# Patient Record
Sex: Male | Born: 1975 | Race: Black or African American | Hispanic: No | Marital: Single | Smoking: Former smoker
Health system: Southern US, Community
[De-identification: ages and names within clinical notes are randomized; demographics above are authoritative.]

## PROBLEM LIST (undated history)

## (undated) DIAGNOSIS — G510 Bell's palsy: Secondary | ICD-10-CM

## (undated) DIAGNOSIS — S0181XA Laceration without foreign body of other part of head, initial encounter: Secondary | ICD-10-CM

## (undated) HISTORY — PX: FACIAL COSMETIC SURGERY: SHX629

---

## 2000-11-01 ENCOUNTER — Emergency Department (HOSPITAL_COMMUNITY): Admission: EM | Admit: 2000-11-01 | Discharge: 2000-11-01 | Payer: Self-pay | Admitting: Internal Medicine

## 2005-01-31 ENCOUNTER — Encounter: Payer: Self-pay | Admitting: Emergency Medicine

## 2005-01-31 ENCOUNTER — Inpatient Hospital Stay (HOSPITAL_COMMUNITY): Admission: EM | Admit: 2005-01-31 | Discharge: 2005-02-02 | Payer: Self-pay | Admitting: Emergency Medicine

## 2005-03-05 ENCOUNTER — Ambulatory Visit (HOSPITAL_COMMUNITY): Admission: RE | Admit: 2005-03-05 | Discharge: 2005-03-05 | Payer: Self-pay | Admitting: Otolaryngology
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
## 2014-02-13 ENCOUNTER — Encounter (HOSPITAL_COMMUNITY): Payer: Self-pay | Admitting: *Deleted

## 2014-02-13 ENCOUNTER — Emergency Department (HOSPITAL_COMMUNITY)
Admission: EM | Admit: 2014-02-13 | Discharge: 2014-02-13 | Disposition: A | Discharge status: Home / Self Care | Payer: Self-pay | Attending: Emergency Medicine | Admitting: Emergency Medicine

## 2014-02-13 DIAGNOSIS — G510 Bell's palsy: Secondary | ICD-10-CM | POA: Insufficient documentation

## 2014-02-13 DIAGNOSIS — Z720 Tobacco use: Secondary | ICD-10-CM | POA: Insufficient documentation

## 2014-02-13 DIAGNOSIS — M79605 Pain in left leg: Secondary | ICD-10-CM | POA: Insufficient documentation

## 2014-02-13 HISTORY — DX: Bell's palsy: G51.0

## 2014-02-13 HISTORY — DX: Laceration without foreign body of other part of head, initial encounter: S01.81XA

## 2014-02-13 MED ORDER — PREDNISONE 50 MG PO TABS
60.0000 mg | ORAL_TABLET | Freq: Once | ORAL | Status: AC
  Administered 2014-02-13: 60 mg via ORAL
  Filled 2014-02-13 (×2): qty 1

## 2014-02-13 MED ORDER — HYDROCODONE-ACETAMINOPHEN 5-325 MG PO TABS: 1.0000 | ORAL_TABLET | Freq: Four times a day (QID) | ORAL | Status: DC | PRN

## 2014-02-13 MED ORDER — VALACYCLOVIR HCL 1 G PO TABS: 1000.0000 mg | ORAL_TABLET | Freq: Three times a day (TID) | ORAL | Status: DC

## 2014-02-13 MED ORDER — PREDNISONE 20 MG PO TABS: ORAL_TABLET | ORAL | Status: DC
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
## 2014-02-13 NOTE — ED Notes (Signed)
Pt states woke up yesterday morning around 1100 & noticed he had some facial drooping. Pt dx w/ bail palsy in march of this year. Pt also states has been having left leg pain that radiates to feet for the past 3 weeks or so.

## 2014-02-13 NOTE — Discharge Instructions (Signed)
Bell's Palsy °Bell's palsy is a condition in which the muscles on one side of the face cannot move (paralysis). This is because the nerves in the face are paralyzed. It is most often thought to be caused by a virus. The virus causes swelling of the nerve that controls movement on one side of the face. The nerve travels through a tight space surrounded by bone. When the nerve swells, it can be compressed by the bone. This results in damage to the protective covering around the nerve. This damage interferes with how the nerve communicates with the muscles of the face. As a result, it can cause weakness or paralysis of the facial muscles.  °Injury (trauma), tumor, and surgery may cause Bell's palsy, but most of the time the cause is unknown. It is a relatively common condition. It starts suddenly (abrupt onset) with the paralysis usually ending within 2 days. Bell's palsy is not dangerous. But because the eye does not close properly, you may need care to keep the eye from getting dry. This can include splinting (to keep the eye shut) or moistening with artificial tears. Bell's palsy very seldom occurs on both sides of the face at the same time. °SYMPTOMS  °· Eyebrow sagging. °· Drooping of the eyelid and corner of the mouth. °· Inability to close one eye. °· Loss of taste on the front of the tongue. °· Sensitivity to loud noises. °TREATMENT  °The treatment is usually non-surgical. If the patient is seen within the first 24 to 48 hours, a short course of steroids may be prescribed, in an attempt to shorten the length of the condition. Antiviral medicines may also be used with the steroids, but it is unclear if they are helpful.  °You will need to protect your eye, if you cannot close it. The cornea (clear covering over your eye) will become dry and can be damaged. Artificial tears can be used to keep your eye moist. Glasses or an eye patch should be worn to protect your eye. °PROGNOSIS  °Recovery is variable, ranging  from days to months. Although the problem usually goes away completely (about 80% of cases resolve), predicting the outcome is impossible. Most people improve within 3 weeks of when the symptoms began. Improvement may continue for 3 to 6 months. A small number of people have moderate to severe weakness that is permanent.  °HOME CARE INSTRUCTIONS  °· If your caregiver prescribed medication to reduce swelling in the nerve, use as directed. Do not stop taking the medication unless directed by your caregiver. °· Use moisturizing eye drops as needed to prevent drying of your eye, as directed by your caregiver. °· Protect your eye, as directed by your caregiver. °· Use facial massage and exercises, as directed by your caregiver. °· Perform your normal activities, and get your normal rest. °SEEK IMMEDIATE MEDICAL CARE IF:  °· There is pain, redness or irritation in the eye. °· You or your child has an oral temperature above 102° F (38.9° C), not controlled by medicine. °MAKE SURE YOU:  °· Understand these instructions. °· Will watch your condition. °· Will get help right away if you are not doing well or get worse. °Document Released: 02/03/2005 Document Revised: 04/28/2011 Document Reviewed: 05/13/2013 °[employer]® Patient Information ©2015 [employer]. This information is not intended to replace advice given to you by your health care provider. Make sure you discuss any questions you have with your health care provider. ° °

## 2018-02-11 ENCOUNTER — Emergency Department (HOSPITAL_COMMUNITY): Payer: Self-pay

## 2018-02-11 ENCOUNTER — Other Ambulatory Visit: Payer: Self-pay

## 2018-02-11 ENCOUNTER — Encounter (HOSPITAL_COMMUNITY): Payer: Self-pay | Admitting: Emergency Medicine

## 2018-02-11 ENCOUNTER — Emergency Department (HOSPITAL_COMMUNITY)
Admission: EM | Admit: 2018-02-11 | Discharge: 2018-02-11 | Disposition: A | Discharge status: Home / Self Care | Payer: Self-pay | Attending: Emergency Medicine | Admitting: Emergency Medicine

## 2018-02-11 DIAGNOSIS — R3129 Other microscopic hematuria: Secondary | ICD-10-CM | POA: Insufficient documentation

## 2018-02-11 DIAGNOSIS — M546 Pain in thoracic spine: Secondary | ICD-10-CM | POA: Insufficient documentation

## 2018-02-11 DIAGNOSIS — F17200 Nicotine dependence, unspecified, uncomplicated: Secondary | ICD-10-CM | POA: Insufficient documentation

## 2018-02-11 LAB — BASIC METABOLIC PANEL
Anion gap: 4 — ABNORMAL LOW (ref 5–15)
BUN: 13 mg/dL (ref 6–20)
CO2: 27 mmol/L (ref 22–32)
Calcium: 10 mg/dL (ref 8.9–10.3)
Chloride: 108 mmol/L (ref 98–111)
Creatinine, Ser: 1.02 mg/dL (ref 0.61–1.24)
GFR calc Af Amer: >60 mL/min (ref >60)
GFR calc non Af Amer: >60 mL/min (ref >60)
Glucose, Bld: 94 mg/dL (ref 70–99)
POTASSIUM: 4.1 mmol/L (ref 3.5–5.1)
Sodium: 139 mmol/L (ref 135–145)

## 2018-02-11 LAB — CBC WITH DIFFERENTIAL/PLATELET
Abs Immature Granulocytes: 0.03 10*3/uL (ref 0.00–0.07)
BASOS ABS: 0 10*3/uL (ref 0.0–0.1)
Basophils Relative: 0 %
Eosinophils Absolute: 0.1 10*3/uL (ref 0.0–0.5)
Eosinophils Relative: 1 %
HCT: 48.8 % (ref 39.0–52.0)
Hemoglobin: 16.3 g/dL (ref 13.0–17.0)
Immature Granulocytes: 0 %
Lymphocytes Relative: 42 %
Lymphs Abs: 3.2 10*3/uL (ref 0.7–4.0)
MCH: 30 pg (ref 26.0–34.0)
MCHC: 33.4 g/dL (ref 30.0–36.0)
MCV: 89.9 fL (ref 80.0–100.0)
Monocytes Absolute: 0.4 10*3/uL (ref 0.1–1.0)
Monocytes Relative: 5 %
NRBC: 0 % (ref 0.0–0.2)
Neutro Abs: 4 10*3/uL (ref 1.7–7.7)
Neutrophils Relative %: 52 %
Platelets: 262 10*3/uL (ref 150–400)
RBC: 5.43 MIL/uL (ref 4.22–5.81)
RDW: 13 % (ref 11.5–15.5)
WBC: 7.7 10*3/uL (ref 4.0–10.5)

## 2018-02-11 LAB — URINALYSIS, ROUTINE W REFLEX MICROSCOPIC
Bacteria, UA: NONE SEEN
Bilirubin Urine: NEGATIVE
Glucose, UA: NEGATIVE mg/dL
Ketones, ur: NEGATIVE mg/dL
Leukocytes, UA: NEGATIVE
Nitrite: NEGATIVE
Protein, ur: NEGATIVE mg/dL
Specific Gravity, Urine: 1.015 (ref 1.005–1.030)
pH: 6 (ref 5.0–8.0)

## 2018-02-11 MED ORDER — IBUPROFEN 400 MG PO TABS
600.0000 mg | ORAL_TABLET | Freq: Once | ORAL | Status: AC
  Administered 2018-02-11: 600 mg via ORAL
  Filled 2018-02-11: qty 2

## 2018-02-11 NOTE — ED Triage Notes (Signed)
Patient complaining of right flank pain x 1 week. Denies dysuria. 

## 2018-02-11 NOTE — ED Notes (Signed)
Patient given discharge instruction, verbalized understand. Patient ambulatory out of the department.  
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
## 2018-02-11 NOTE — ED Notes (Signed)
Pt gone to CT 

## 2018-02-11 NOTE — Discharge Instructions (Signed)
Take ibuprofen and/or Tylenol for your pain.  If you develop worsening pain, weakness or numbness in your legs, incontinence of your bowels or bladder, vomiting, or any other new/concerning symptoms then return to the ER for evaluation.  There was some mild amount of blood seen in your urine test today.  You need to have this followed up with your primary care doctor to get this rechecked to ensure it clears.

## 2021-09-21 ENCOUNTER — Emergency Department (HOSPITAL_COMMUNITY)
Admission: EM | Admit: 2021-09-21 | Discharge: 2021-09-21 | Disposition: A | Discharge status: Home / Self Care | Payer: Self-pay | Attending: Student | Admitting: Student

## 2021-09-21 ENCOUNTER — Encounter (HOSPITAL_COMMUNITY): Payer: Self-pay

## 2021-09-21 DIAGNOSIS — K0889 Other specified disorders of teeth and supporting structures: Secondary | ICD-10-CM | POA: Insufficient documentation

## 2021-09-21 MED ORDER — IBUPROFEN 600 MG PO TABS: 600.0000 mg | ORAL_TABLET | Freq: Four times a day (QID) | ORAL | 0 refills | Status: DC | PRN

## 2021-09-21 MED ORDER — AMOXICILLIN-POT CLAVULANATE 875-125 MG PO TABS: 1.0000 | ORAL_TABLET | Freq: Two times a day (BID) | ORAL | 0 refills | Status: DC

## 2021-09-21 MED ORDER — IBUPROFEN 800 MG PO TABS
800.0000 mg | ORAL_TABLET | Freq: Once | ORAL | Status: AC
  Administered 2021-09-21: 800 mg via ORAL
  Filled 2021-09-21: qty 1

## 2021-09-21 NOTE — ED Triage Notes (Addendum)
Pt states he has been having swelling in right neck lymph nodes since this morning. Pt is concerned that he has a tooth infection/abscess on right  lower side of mouth

## 2021-09-21 NOTE — ED Provider Notes (Signed)
[facility] EMERGENCY DEPARTMENT Provider Note   CSN: [id number] Arrival date & time: 09/21/21  1754     History Chief Complaint  Patient presents with   Dental Pain    Ivan Johnson is a 46 y.o. male patient who presents to the emergency department today for further evaluation of dental pain.  This been ongoing for the last few days.  He has been trying to self medicate at home.  He has some associated pain down into the right side of the neck.  Able to eat and drink normally.   Dental Pain      Home Medications Prior to Admission medications   Medication Sig Start Date End Date Taking? Authorizing Provider  amoxicillin-clavulanate (AUGMENTIN) 875-125 MG tablet Take 1 tablet by mouth every 12 (twelve) hours. 09/21/21  Yes Meredeth Ide, Iktan Aikman M, PA-C  ibuprofen (ADVIL) 600 MG tablet Take 1 tablet (600 mg total) by mouth every 6 (six) hours as needed. 09/21/21  Yes Meredeth Ide, Kately Graffam M, PA-C  HYDROcodone-acetaminophen (NORCO/VICODIN) 5-325 MG per tablet Take 1 tablet by mouth every 6 (six) hours as needed for severe pain. 02/13/14   Pricilla Loveless, MD  predniSONE (DELTASONE) 20 MG tablet 60 mg x 4 days, 50 mg x 1 day, 40 mg x 1 day, 30 mg x 1 day, 20 mg x 1 day, 10 mg x 1 day, then stop 02/13/14   Pricilla Loveless, MD  valACYclovir (VALTREX) 1000 MG tablet Take 1 tablet (1,000 mg total) by mouth 3 (three) times daily. 02/13/14   Pricilla Loveless, MD      Allergies    Patient has no known allergies.    Review of Systems   Review of Systems  All other systems reviewed and are negative.   Physical Exam Updated Vital Signs BP (!) 141/91 (BP Location: Right Arm)   Pulse 84   Temp 98.4 F (36.9 C) (Oral)   Resp 16   Ht 6\' 2"  (1.88 m)   Wt 116.7 kg   SpO2 98%   BMI 33.04 kg/m  Physical Exam Vitals and nursing note reviewed.  Constitutional:      Appearance: Normal appearance.  HENT:     Head: Normocephalic and atraumatic.     Mouth/Throat:     Comments: No obvious fluctuance  seen over the right lower gumline primarily in the back molars.  No swelling of the oral floor.  No posterior pharyngeal erythema. Eyes:     General:        Right eye: No discharge.        Left eye: No discharge.     Conjunctiva/sclera: Conjunctivae normal.  Pulmonary:     Effort: Pulmonary effort is normal.  Skin:    General: Skin is warm and dry.     Findings: No rash.  Neurological:     General: No focal deficit present.     Mental Status: He is alert.  Psychiatric:        Mood and Affect: Mood normal.        Behavior: Behavior normal.     ED Results / Procedures / Treatments   Labs (all labs ordered are listed, but only abnormal results are displayed) Labs Reviewed - No data to display  EKG None  Radiology No results found.  Procedures Procedures    Medications Ordered in ED Medications  ibuprofen (ADVIL) tablet 800 mg (has no administration in time range)    ED Course/ Medical Decision Making/ A&P  Medical Decision Making Ivan Johnson is a 46 y.o. male patient who presents to the emergency department today for further evaluation of dental pain.  I have a low suspicion for Ludwig's angina, RPA, or PTA at this time.  Patient's talking complete sentences and maintaining a patent airway.  I do not see any obvious abscess that is drainable today.  I will place the patient on some ibuprofen here with a prescription of ibuprofen to go home with in addition to antibiotics.  Patient mental this plan.  He is safe for discharge at this time.  He will follow-up with the sinus.  Strict return precaution discussed.   Risk Prescription drug management.    Final Clinical Impression(s) / ED Diagnoses Final diagnoses:  Pain, dental    Rx / DC Orders ED Discharge Orders          Ordered    amoxicillin-clavulanate (AUGMENTIN) 875-125 MG tablet  Every 12 hours        09/21/21 1845    ibuprofen (ADVIL) 600 MG tablet  Every 6 hours PRN         09/21/21 1845              Teressa Lower, PA-C 09/21/21 1857    Glendora Score, MD 09/24/21 [phone]

## 2021-09-21 NOTE — Discharge Instructions (Signed)
Please follow-up with your dentist for possible tooth extraction.  Return to the emerge department for any worsening symptoms.  Please take medications as prescribed.

## 2022-02-06 ENCOUNTER — Encounter (HOSPITAL_COMMUNITY): Payer: Self-pay | Admitting: *Deleted

## 2022-02-06 ENCOUNTER — Emergency Department (HOSPITAL_COMMUNITY): Payer: No Typology Code available for payment source

## 2022-02-06 ENCOUNTER — Other Ambulatory Visit: Payer: Self-pay

## 2022-02-06 ENCOUNTER — Emergency Department (HOSPITAL_COMMUNITY)
Admission: EM | Admit: 2022-02-06 | Discharge: 2022-02-06 | Disposition: A | Discharge status: Home / Self Care | Payer: No Typology Code available for payment source | Attending: Emergency Medicine | Admitting: Emergency Medicine

## 2022-02-06 DIAGNOSIS — R209 Unspecified disturbances of skin sensation: Secondary | ICD-10-CM | POA: Insufficient documentation

## 2022-02-06 DIAGNOSIS — Y92410 Unspecified street and highway as the place of occurrence of the external cause: Secondary | ICD-10-CM | POA: Diagnosis not present

## 2022-02-06 DIAGNOSIS — M5412 Radiculopathy, cervical region: Secondary | ICD-10-CM | POA: Insufficient documentation

## 2022-02-06 DIAGNOSIS — M791 Myalgia, unspecified site: Secondary | ICD-10-CM | POA: Insufficient documentation

## 2022-02-06 DIAGNOSIS — M7918 Myalgia, other site: Secondary | ICD-10-CM
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
## 2024-03-01 ENCOUNTER — Other Ambulatory Visit: Payer: Self-pay

## 2024-03-01 ENCOUNTER — Emergency Department (HOSPITAL_COMMUNITY)
Admission: EM | Admit: 2024-03-01 | Discharge: 2024-03-01 | Disposition: A | Discharge status: Home / Self Care | Payer: Self-pay | Attending: Emergency Medicine | Admitting: Emergency Medicine

## 2024-03-01 ENCOUNTER — Encounter (HOSPITAL_COMMUNITY): Payer: Self-pay | Admitting: Emergency Medicine

## 2024-03-01 DIAGNOSIS — G510 Bell's palsy: Secondary | ICD-10-CM | POA: Insufficient documentation

## 2024-03-01 MED ORDER — PREDNISONE 10 MG PO TABS: 60.0000 mg | ORAL_TABLET | Freq: Every day | ORAL | 0 refills | Status: AC

## 2024-03-01 MED ORDER — HYPROMELLOSE (GONIOSCOPIC) 2.5 % OP SOLN: 1.0000 [drp] | OPHTHALMIC | 2 refills | Status: AC | PRN

## 2024-03-01 NOTE — Discharge Instructions (Addendum)
 A prescription for prednisone  was sent to your pharmacy.  Take as prescribed for the next 7 days.  Use artificial tears as needed for dry eyes.  Tape your right eye shut at night as needed.  Follow-up with your primary care doctor.  Return to the emergency department for any new or worsening symptoms of concern.

## 2024-03-01 NOTE — ED Provider Notes (Signed)
 " [place] EMERGENCY DEPARTMENT AT [facility] Provider Note   CSN: [id number] Arrival date & time: 03/01/24  9179     Patient presents with: Facial Droop   Ivan Johnson is a 49 y.o. male.   HPI Patient presents for right-sided facial droop.  Medical history includes 2 prior episodes of Bell's palsy.  Most recently, he had an episode 10 years ago.  He has been treated with steroids in the past with near resolution of visual deficits.  He describes recent flulike symptoms which have resolved.  2 days ago, he noticed a recurrence of right-sided facial droop.  He denies any other associated symptoms.    Prior to Admission medications  Medication Sig Start Date End Date Taking? Authorizing Provider  hydroxypropyl methylcellulose / hypromellose (ISOPTO TEARS / GONIOVISC) 2.5 % ophthalmic solution Place 1 drop into the right eye as needed for dry eyes. 03/01/24  Yes Melvenia Motto, MD  predniSONE  (DELTASONE ) 10 MG tablet Take 6 tablets (60 mg total) by mouth daily for 7 days. 03/01/24 03/08/24 Yes Melvenia Motto, MD    Allergies: Patient has no known allergies.    Review of Systems  Neurological:  Positive for facial asymmetry.  All other systems reviewed and are negative.   Updated Vital Signs Ht 6' 2 (1.88 m)   Wt 118 kg   BMI 33.40 kg/m   Physical Exam Vitals and nursing note reviewed.  Constitutional:      General: He is not in acute distress.    Appearance: Normal appearance. He is well-developed. He is not ill-appearing, toxic-appearing or diaphoretic.  HENT:     Head: Normocephalic and atraumatic.     Right Ear: External ear normal.     Left Ear: External ear normal.     Nose: Nose normal.     Mouth/Throat:     Mouth: Mucous membranes are moist.  Eyes:     Extraocular Movements: Extraocular movements intact.     Conjunctiva/sclera: Conjunctivae normal.  Cardiovascular:     Rate and Rhythm: Normal rate and regular rhythm.  Pulmonary:     Effort: Pulmonary  effort is normal. No respiratory distress.  Abdominal:     General: There is no distension.     Palpations: Abdomen is soft.  Musculoskeletal:        General: No swelling. Normal range of motion.     Cervical back: Normal range of motion and neck supple.  Skin:    General: Skin is warm and dry.     Capillary Refill: Capillary refill takes less than 2 seconds.  Neurological:     Mental Status: He is alert.     Cranial Nerves: Cranial nerve deficit present.     Sensory: No sensory deficit.     Motor: No weakness.     Coordination: Coordination normal.     Comments: Right-sided facial droop with forehead involvement consistent with Bell's palsy.  No other focal neurologic deficits.  Psychiatric:        Mood and Affect: Mood normal.        Behavior: Behavior normal.     (all labs ordered are listed, but only abnormal results are displayed) Labs Reviewed - No data to display  EKG: None  Radiology: No results found.   Procedures   Medications Ordered in the ED - No data to display  Medical Decision Making  Patient presents for right-sided facial droop with forehead involvement.  This was first noticed 2 days ago.  He does have prior episodes of Bell's palsy.  Patient's exam is consistent with Bell's palsy.  He has no other focal neurologic deficits.  Patient was prescribed 1 week of prednisone .  He declines initial dose in the emergency department.  He would prefer to just pick it up from the pharmacy.  He was counseled on use of artificial tears and taping eye shut at night as needed.  He was discharged in stable condition.     Final diagnoses:  Bell's palsy    ED Discharge Orders          Ordered    predniSONE  (DELTASONE ) 10 MG tablet  Daily        03/01/24 0847    hydroxypropyl methylcellulose / hypromellose (ISOPTO TEARS / GONIOVISC) 2.5 % ophthalmic solution  As needed        03/01/24 0847               Melvenia Motto,  MD 03/01/24 [phone]  "

## 2024-03-01 NOTE — ED Triage Notes (Signed)
 Pt in by POV c/o bells palsy x2 days. Hx of the same
